# Patient Record
Sex: Male | Born: 2000 | Race: White | Hispanic: Yes | Marital: Single | Smoking: Never smoker
Health system: Southern US, Community
[De-identification: ages and names within clinical notes are randomized; demographics above are authoritative.]

## PROBLEM LIST (undated history)

## (undated) DIAGNOSIS — R6250 Unspecified lack of expected normal physiological development in childhood: Secondary | ICD-10-CM

## (undated) DIAGNOSIS — F88 Other disorders of psychological development: Secondary | ICD-10-CM

## (undated) DIAGNOSIS — F845 Asperger's syndrome: Secondary | ICD-10-CM

## (undated) DIAGNOSIS — F840 Autistic disorder: Secondary | ICD-10-CM

## (undated) DIAGNOSIS — R630 Anorexia: Secondary | ICD-10-CM

## (undated) DIAGNOSIS — F909 Attention-deficit hyperactivity disorder, unspecified type: Secondary | ICD-10-CM

## (undated) HISTORY — PX: TONSILLECTOMY AND ADENOIDECTOMY: SHX28

## (undated) HISTORY — DX: Other disorders of psychological development: F88

## (undated) HISTORY — DX: Anorexia: R63.0

## (undated) HISTORY — DX: Unspecified lack of expected normal physiological development in childhood: R62.50

## (undated) HISTORY — DX: Attention-deficit hyperactivity disorder, unspecified type: F90.9

## (undated) HISTORY — DX: Asperger's syndrome: F84.5

## (undated) HISTORY — PX: OTHER SURGICAL HISTORY: SHX169

## (undated) HISTORY — DX: Autistic disorder: F84.0

---
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
## 2019-11-07 ENCOUNTER — Ambulatory Visit: Payer: Self-pay | Attending: Internal Medicine

## 2019-11-07 DIAGNOSIS — Z23 Encounter for immunization: Secondary | ICD-10-CM

## 2019-11-07 NOTE — Progress Notes (Signed)
   Covid-19 Vaccination Clinic  Name:  Cory Neal    MRN: [id number] DOB: 08-09-2001  11/07/2019  Mr. Cory Neal was observed post Covid-19 immunization for 15 minutes without incident. He was provided with Vaccine Information Sheet and instruction to access the V-Safe system.   Cory Neal was instructed to call 911 with any severe reactions post vaccine: Marland Kitchen Difficulty breathing  . Swelling of face and throat  . A fast heartbeat  . A bad rash all over body  . Dizziness and weakness   Immunizations Administered    Name Date Dose VIS Date Route   Pfizer COVID-19 Vaccine 11/07/2019 12:58 PM 0.3 mL 08/08/2019 Intramuscular   Manufacturer: [employer], [employer]   Lot: [id number]   NDC: [address]-5

## 2019-12-01 ENCOUNTER — Ambulatory Visit: Payer: Self-pay | Attending: Internal Medicine

## 2019-12-01 DIAGNOSIS — Z23 Encounter for immunization: Secondary | ICD-10-CM

## 2019-12-01 NOTE — Progress Notes (Signed)
   Covid-19 Vaccination Clinic  Name:  Thamas Appleyard    MRN: [id number] DOB: 2000/11/20  12/01/2019  Mr. Dacanay was observed post Covid-19 immunization for 15 minutes without incident. He was provided with Vaccine Information Sheet and instruction to access the V-Safe system.   Mr. Leasure was instructed to call 911 with any severe reactions post vaccine: Marland Kitchen Difficulty breathing  . Swelling of face and throat  . A fast heartbeat  . A bad rash all over body  . Dizziness and weakness   Immunizations Administered    Name Date Dose VIS Date Route   Pfizer COVID-19 Vaccine 12/01/2019  3:08 PM 0.3 mL 08/08/2019 Intramuscular   Manufacturer: [employer], [employer]   Lot: [id number]   NDC: [address]-8

## 2020-01-07 ENCOUNTER — Ambulatory Visit: Payer: Self-pay | Attending: Internal Medicine

## 2020-01-07 DIAGNOSIS — Z20822 Contact with and (suspected) exposure to covid-19: Secondary | ICD-10-CM

## 2020-01-08 LAB — SARS-COV-2, NAA 2 DAY TAT

## 2020-01-08 LAB — NOVEL CORONAVIRUS, NAA: SARS-CoV-2, NAA: NOT DETECTED

## 2022-06-12 ENCOUNTER — Emergency Department (HOSPITAL_BASED_OUTPATIENT_CLINIC_OR_DEPARTMENT_OTHER)
Admission: EM | Admit: 2022-06-12 | Discharge: 2022-06-12 | Disposition: A | Discharge status: Home / Self Care | Attending: Emergency Medicine | Admitting: Emergency Medicine

## 2022-06-12 ENCOUNTER — Emergency Department (HOSPITAL_BASED_OUTPATIENT_CLINIC_OR_DEPARTMENT_OTHER)

## 2022-06-12 ENCOUNTER — Other Ambulatory Visit: Payer: Self-pay

## 2022-06-12 ENCOUNTER — Encounter (HOSPITAL_BASED_OUTPATIENT_CLINIC_OR_DEPARTMENT_OTHER): Payer: Self-pay | Admitting: Emergency Medicine

## 2022-06-12 DIAGNOSIS — R1031 Right lower quadrant pain: Secondary | ICD-10-CM | POA: Insufficient documentation

## 2022-06-12 LAB — URINALYSIS, ROUTINE W REFLEX MICROSCOPIC
Bilirubin Urine: NEGATIVE
Glucose, UA: NEGATIVE mg/dL
Hgb urine dipstick: NEGATIVE
Ketones, ur: NEGATIVE mg/dL
Leukocytes,Ua: NEGATIVE
Nitrite: NEGATIVE
Specific Gravity, Urine: 1.026 (ref 1.005–1.030)
pH: 6.5 (ref 5.0–8.0)

## 2022-06-12 LAB — COMPREHENSIVE METABOLIC PANEL
ALT: 26 U/L (ref 0–44)
AST: 17 U/L (ref 15–41)
Albumin: 5.1 g/dL — ABNORMAL HIGH (ref 3.5–5.0)
Alkaline Phosphatase: 94 U/L (ref 38–126)
Anion gap: 9 (ref 5–15)
BUN: 14 mg/dL (ref 6–20)
CO2: 26 mmol/L (ref 22–32)
Calcium: 9.9 mg/dL (ref 8.9–10.3)
Chloride: 103 mmol/L (ref 98–111)
Creatinine, Ser: 0.87 mg/dL (ref 0.61–1.24)
GFR, Estimated: >60 mL/min (ref >60)
Glucose, Bld: 97 mg/dL (ref 70–99)
Potassium: 4 mmol/L (ref 3.5–5.1)
Sodium: 138 mmol/L (ref 135–145)
Total Bilirubin: 0.3 mg/dL (ref 0.3–1.2)
Total Protein: 8 g/dL (ref 6.5–8.1)

## 2022-06-12 LAB — CBC
HCT: 45.8 % (ref 39.0–52.0)
Hemoglobin: 15.9 g/dL (ref 13.0–17.0)
MCH: 29 pg (ref 26.0–34.0)
MCHC: 34.7 g/dL (ref 30.0–36.0)
MCV: 83.6 fL (ref 80.0–100.0)
Platelets: 224 10*3/uL (ref 150–400)
RBC: 5.48 MIL/uL (ref 4.22–5.81)
RDW: 12.1 % (ref 11.5–15.5)
WBC: 4.8 10*3/uL (ref 4.0–10.5)
nRBC: 0 % (ref 0.0–0.2)

## 2022-06-12 LAB — LIPASE, BLOOD: Lipase: 13 U/L (ref 11–51)

## 2022-06-12 MED ORDER — IOHEXOL 300 MG/ML  SOLN
100.0000 mL | Freq: Once | INTRAMUSCULAR | Status: AC | PRN
  Administered 2022-06-12: 75 mL via INTRAVENOUS

## 2022-06-12 NOTE — ED Provider Notes (Signed)
[facility] EMERGENCY DEPT Provider Note   CSN: [id number] Arrival date & time: 06/12/22  1724     History  Chief Complaint  Patient presents with   Abdominal Pain    Cory Neal is a 21 y.o. male.  Patient is a 21 year old male past medical history of Asperger's disease presenting with his mom for complaints of abdominal pain.  She admits to right lower quadrant abdominal pain intermittently for the past 10 days.  Denies any fevers, chills, nausea, vomiting.  Last bowel movement was described as normal and yesterday.  No prior abdominal surgeries.  Sick contacts.  The history is provided by the patient. No language interpreter was used.  Abdominal Pain Associated symptoms: no chest pain, no chills, no cough, no dysuria, no fever, no hematuria, no shortness of breath, no sore throat and no vomiting        Home Medications Prior to Admission medications   Medication Sig Start Date End Date Taking? Authorizing Provider  carbamazepine (TEGRETOL) 200 MG tablet TK 1 T PO NIGHTLY FOR MOOD STABILIZATION 07/05/15   [provider]  fluvoxaMINE (LUVOX) 50 MG tablet TK 2 TS PO HS FOR OCD/ANXIETY 07/05/15   [provider]      Allergies    Patient has no known allergies.    Review of Systems   Review of Systems  Constitutional:  Negative for chills and fever.  HENT:  Negative for ear pain and sore throat.   Eyes:  Negative for pain and visual disturbance.  Respiratory:  Negative for cough and shortness of breath.   Cardiovascular:  Negative for chest pain and palpitations.  Gastrointestinal:  Positive for abdominal pain. Negative for vomiting.  Genitourinary:  Negative for dysuria and hematuria.  Musculoskeletal:  Negative for arthralgias and back pain.  Skin:  Negative for color change and rash.  Neurological:  Negative for seizures and syncope.  All other systems reviewed and are negative.   Physical Exam Updated Vital Signs BP 121/87    Pulse 84   Temp 98.2 F (36.8 C) (Oral)   Resp 16   SpO2 97%  Physical Exam Vitals and nursing note reviewed.  Constitutional:      General: He is not in acute distress.    Appearance: He is well-developed.  HENT:     Head: Normocephalic and atraumatic.  Eyes:     Conjunctiva/sclera: Conjunctivae normal.  Cardiovascular:     Rate and Rhythm: Normal rate and regular rhythm.     Heart sounds: No murmur heard. Pulmonary:     Effort: Pulmonary effort is normal. No respiratory distress.     Breath sounds: Normal breath sounds.  Abdominal:     Palpations: Abdomen is soft.     Tenderness: There is abdominal tenderness in the right lower quadrant. There is no guarding or rebound.  Musculoskeletal:        General: No swelling.     Cervical back: Neck supple.  Skin:    General: Skin is warm and dry.     Capillary Refill: Capillary refill takes less than 2 seconds.  Neurological:     Mental Status: He is alert.  Psychiatric:        Mood and Affect: Mood normal.     ED Results / Procedures / Treatments   Labs (all labs ordered are listed, but only abnormal results are displayed) Labs Reviewed  COMPREHENSIVE METABOLIC PANEL - Abnormal; Notable for the following components:      Result Value  Albumin 5.1 (*)    All other components within normal limits  URINALYSIS, ROUTINE W REFLEX MICROSCOPIC - Abnormal; Notable for the following components:   Protein, ur TRACE (*)    All other components within normal limits  LIPASE, BLOOD  CBC    EKG None  Radiology CT ABDOMEN PELVIS W CONTRAST  Result Date: 06/12/2022 CLINICAL DATA:  Right lower abdominal pain EXAM: CT ABDOMEN AND PELVIS WITH CONTRAST TECHNIQUE: Multidetector CT imaging of the abdomen and pelvis was performed using the standard protocol following bolus administration of intravenous contrast. RADIATION DOSE REDUCTION: This exam was performed according to the departmental dose-optimization program which includes  automated exposure control, adjustment of the mA and/or kV according to patient size and/or use of iterative reconstruction technique. CONTRAST:  71mL OMNIPAQUE IOHEXOL 300 MG/ML  SOLN COMPARISON:  None Available. FINDINGS: Lower chest: No acute abnormality Hepatobiliary: No focal hepatic abnormality. Gallbladder unremarkable. Pancreas: No focal abnormality or ductal dilatation. Spleen: No focal abnormality.  Normal size. Adrenals/Urinary Tract: No adrenal abnormality. No focal renal abnormality. No stones or hydronephrosis. Urinary bladder is unremarkable. Stomach/Bowel: Normal appendix. Stomach, large and small bowel grossly unremarkable. Vascular/Lymphatic: No evidence of aneurysm or adenopathy. Reproductive: No visible focal abnormality. Other: No free fluid or free air. Musculoskeletal: No acute bony abnormality. IMPRESSION: Normal appendix. No acute findings in the abdomen or pelvis. Electronically Signed   By: Rolm Baptise M.D.   On: 06/12/2022 20:26    Procedures Procedures    Medications Ordered in ED Medications  iohexol (OMNIPAQUE) 300 MG/ML solution 100 mL (75 mLs Intravenous Contrast Given 06/12/22 2011)    ED Course/ Medical Decision Making/ A&P                           Medical Decision Making Amount and/or Complexity of Data Reviewed Labs: ordered.   73:107 PM   21 year old male past medical history of Asperger's disease presenting with his mom for complaints of abdominal pain.  Patient is alert oriented x3, no acute distress, afebrile, stable vital signs.  Physical exam demonstrates soft abdomen without guarding or rigidity.  Tenderness to palpation of the right lower quadrant.  Differential diagnosis includes but is not limited to constipation, appendicitis, diverticulitis, hernia, etc.  I independently interpreted patient's labs.  Laboratory studies demonstrate no signs or symptoms of sepsis.  No leukocytosis.  Normal renal function.  Stable liver profile and lipase.  CT  abd/pelvis with iv and oral contrast demonstrates no acute process. No appendicitis. Appendix visualized.   Patient offered medication for pain and declined at this time.  Able to tolerate p.o. without difficulty.  Patient in no distress and overall condition improved here in the ED. Detailed discussions were had with the patient regarding current findings, and need for close f/u with PCP or on call doctor. The patient has been instructed to return immediately if the symptoms worsen in any way for re-evaluation. Patient verbalized understanding and is in agreement with current care plan. All questions answered prior to discharge.         Final Clinical Impression(s) / ED Diagnoses Final diagnoses:  RLQ abdominal pain    Rx / DC Orders ED Discharge Orders     None         Lianne Cure, DO 16/10/96 2133

## 2022-06-12 NOTE — ED Triage Notes (Signed)
Abdominal pain started last Wednesday. Lower abdo right side. Comes and goes

## 2022-06-12 NOTE — Discharge Instructions (Signed)
Today patient had stable blood work including a liver profile, lipase measuring the pancreatic function, and renal function.  There is no signs or symptoms of infection or sepsis.  No elevated white blood cell count indicating infection.  CT scan of the abdomen and pelvis demonstrates no acute process.  No bowel disease.  No constipation.  No appendicitis.     Please follow-up with your primary care physician in the next 3 to 5 days if right lower quadrant abdominal pain does not improve.

## 2022-06-12 NOTE — ED Provider Triage Note (Signed)
Emergency Medicine Provider Triage Evaluation Note  Cory Neal , a 21 y.o. male  was evaluated in triage.  Pt complains of right lower abdominal pain.  Pt reports pain on and off for 10 days.  Pain is worse now  Review of Systems  Positive: Right lower quadrant pain Negative: fever   Physical Exam  BP 119/84 (BP Location: Right Arm)   Pulse 73   Temp 98.2 F (36.8 C) (Oral)   Resp 16   SpO2 99%  Gen:   Awake, no distress   Resp:  Normal effort  MSK:   Moves extremities without difficulty  Other:  Tender right lower quadrant   Medical Decision Making  Medically screening exam initiated at 5:38 PM.  Appropriate orders placed.  Cory Neal was informed that the remainder of the evaluation will be completed by another provider, this initial triage assessment does not replace that evaluation, and the importance of remaining in the ED until their evaluation is complete.     Fransico Meadow, [place] 06/12/22 1912

## 2023-04-03 ENCOUNTER — Ambulatory Visit: Admitting: Nurse Practitioner

## 2023-04-03 ENCOUNTER — Encounter: Payer: Self-pay | Admitting: Nurse Practitioner

## 2023-04-03 VITALS — BP 110/76 | HR 86 | Ht 66.0 in | Wt 160.0 lb

## 2023-04-03 DIAGNOSIS — Z789 Other specified health status: Secondary | ICD-10-CM

## 2023-04-03 NOTE — Progress Notes (Signed)
[facility]  Subjective:  Patient ID: Cory Neal, male    DOB: 02/06/2001  Age: 22 y.o. MRN: [id number]  CC: Wellness Exam   HPI Cory Neal presents for wellness exam visit for insurance benefit.  Patient has a PCP: Dr. Ricci Barker at [place].  PMH significant for: ADHD and Autism.  Last labs per PCP were completed: Sept 2023  Health Maintenance:  Colonoscopy: No family hx PSA: No family hx    Smoker: never  Immunizations:  Tdap: up to date  COVID- x1 Flu: sometimes.   Lifestyle: Diet- no particular diet  Exercise- very active at work     Past Medical History:  Diagnosis Date   ADHD (attention deficit hyperactivity disorder)    Asperger's disorder    Autism disorder    Global developmental delay    Physical growth delay    Poor appetite     Past Surgical History:  Procedure Laterality Date   arm surgery     pressure equalization tubes     TONSILLECTOMY AND ADENOIDECTOMY      Outpatient Medications Prior to Visit  Medication Sig Dispense Refill   carbamazepine (TEGRETOL) 200 MG tablet TK 1 T PO NIGHTLY FOR MOOD STABILIZATION  1   fluvoxaMINE (LUVOX) 50 MG tablet TK 2 TS PO HS FOR OCD/ANXIETY  1   No facility-administered medications prior to visit.    ROS Review of Systems  Constitutional:  Negative for fatigue.  HENT:  Negative for hearing loss.   Eyes:  Negative for visual disturbance.  Cardiovascular:  Negative for chest pain and leg swelling.  Gastrointestinal:  Negative for constipation, diarrhea, nausea and vomiting.  Musculoskeletal:  Negative for arthralgias and back pain.  Neurological:  Negative for dizziness and headaches.  Psychiatric/Behavioral:  Negative for sleep disturbance. The patient is not hyperactive (stable on current treatment.).     Objective:  BP 110/76   Pulse 86   Ht 5\' 6"  (1.676 m)   Wt 160 lb (72.6 kg)   SpO2 97%   BMI 25.82 kg/m   Physical Exam Constitutional:      General: He is not in acute  distress. HENT:     Head: Normocephalic.     Right Ear: Tympanic membrane, ear canal and external ear normal.     Left Ear: Tympanic membrane, ear canal and external ear normal. There is impacted cerumen.     Nose: Nose normal.     Mouth/Throat:     Mouth: Mucous membranes are moist.     Pharynx: Oropharynx is clear.  Eyes:     Pupils: Pupils are equal, round, and reactive to light.  Cardiovascular:     Rate and Rhythm: Normal rate and regular rhythm.     Heart sounds: Normal heart sounds.  Pulmonary:     Effort: Pulmonary effort is normal.     Breath sounds: Normal breath sounds.  Abdominal:     General: Abdomen is flat. Bowel sounds are normal.     Palpations: Abdomen is soft.  Musculoskeletal:        General: Normal range of motion.     Right lower leg: No edema.     Left lower leg: No edema.  Neurological:     General: No focal deficit present.     Mental Status: He is alert and oriented to person, place, and time.  Psychiatric:        Mood and Affect: Mood normal.        Behavior: Behavior  normal.        Thought Content: Thought content normal.        Judgment: Judgment normal.      Assessment & Plan:    Eryx was seen today for wellness exam.  Diagnoses and all orders for this visit:  Participant in health and wellness plan   Adult wellness physical was conducted today. Importance of diet and exercise were discussed in detail.  We reviewed immunizations and gave recommendations regarding current immunization needed for age.  Preventative health exams are up to date.  Patient was advised yearly wellness exam  No orders of the defined types were placed in this encounter.   No orders of the defined types were placed in this encounter.   Follow-up: as needed

## 2023-12-28 DIAGNOSIS — F902 Attention-deficit hyperactivity disorder, combined type: Secondary | ICD-10-CM | POA: Diagnosis not present

## 2023-12-28 DIAGNOSIS — F422 Mixed obsessional thoughts and acts: Secondary | ICD-10-CM | POA: Diagnosis not present

## 2023-12-28 DIAGNOSIS — F848 Other pervasive developmental disorders: Secondary | ICD-10-CM | POA: Diagnosis not present

## 2024-01-15 DIAGNOSIS — F4322 Adjustment disorder with anxiety: Secondary | ICD-10-CM | POA: Diagnosis not present

## 2024-01-16 DIAGNOSIS — F909 Attention-deficit hyperactivity disorder, unspecified type: Secondary | ICD-10-CM | POA: Diagnosis not present

## 2024-01-16 DIAGNOSIS — F429 Obsessive-compulsive disorder, unspecified: Secondary | ICD-10-CM | POA: Diagnosis not present

## 2024-01-16 DIAGNOSIS — Z8249 Family history of ischemic heart disease and other diseases of the circulatory system: Secondary | ICD-10-CM | POA: Diagnosis not present

## 2024-01-16 DIAGNOSIS — Z818 Family history of other mental and behavioral disorders: Secondary | ICD-10-CM | POA: Diagnosis not present

## 2024-01-16 DIAGNOSIS — Z791 Long term (current) use of non-steroidal anti-inflammatories (NSAID): Secondary | ICD-10-CM | POA: Diagnosis not present

## 2024-02-05 DIAGNOSIS — F4322 Adjustment disorder with anxiety: Secondary | ICD-10-CM | POA: Diagnosis not present

## 2024-03-04 DIAGNOSIS — F4322 Adjustment disorder with anxiety: Secondary | ICD-10-CM | POA: Diagnosis not present

## 2024-03-14 DIAGNOSIS — F4322 Adjustment disorder with anxiety: Secondary | ICD-10-CM | POA: Diagnosis not present

## 2024-03-19 DIAGNOSIS — F422 Mixed obsessional thoughts and acts: Secondary | ICD-10-CM | POA: Diagnosis not present

## 2024-03-19 DIAGNOSIS — F848 Other pervasive developmental disorders: Secondary | ICD-10-CM | POA: Diagnosis not present

## 2024-03-19 DIAGNOSIS — F902 Attention-deficit hyperactivity disorder, combined type: Secondary | ICD-10-CM | POA: Diagnosis not present

## 2024-04-11 DIAGNOSIS — F4322 Adjustment disorder with anxiety: Secondary | ICD-10-CM | POA: Diagnosis not present

## 2024-05-13 DIAGNOSIS — F4322 Adjustment disorder with anxiety: Secondary | ICD-10-CM | POA: Diagnosis not present

## 2024-05-27 DIAGNOSIS — E559 Vitamin D deficiency, unspecified: Secondary | ICD-10-CM | POA: Diagnosis not present

## 2024-05-27 DIAGNOSIS — Z5181 Encounter for therapeutic drug level monitoring: Secondary | ICD-10-CM | POA: Diagnosis not present

## 2024-05-27 DIAGNOSIS — Z0000 Encounter for general adult medical examination without abnormal findings: Secondary | ICD-10-CM | POA: Diagnosis not present

## 2024-06-10 DIAGNOSIS — F4322 Adjustment disorder with anxiety: Secondary | ICD-10-CM | POA: Diagnosis not present

## 2024-06-11 DIAGNOSIS — F902 Attention-deficit hyperactivity disorder, combined type: Secondary | ICD-10-CM | POA: Diagnosis not present

## 2024-06-11 DIAGNOSIS — F848 Other pervasive developmental disorders: Secondary | ICD-10-CM | POA: Diagnosis not present

## 2024-06-11 DIAGNOSIS — F422 Mixed obsessional thoughts and acts: Secondary | ICD-10-CM | POA: Diagnosis not present

## 2024-06-17 DIAGNOSIS — F4322 Adjustment disorder with anxiety: Secondary | ICD-10-CM | POA: Diagnosis not present

## 2024-07-01 DIAGNOSIS — F4322 Adjustment disorder with anxiety: Secondary | ICD-10-CM | POA: Diagnosis not present

## 2024-07-09 DIAGNOSIS — F4322 Adjustment disorder with anxiety: Secondary | ICD-10-CM | POA: Diagnosis not present

## 2024-07-15 DIAGNOSIS — F4322 Adjustment disorder with anxiety: Secondary | ICD-10-CM | POA: Diagnosis not present

## 2024-07-22 DIAGNOSIS — F4322 Adjustment disorder with anxiety: Secondary | ICD-10-CM | POA: Diagnosis not present

## 2024-07-29 DIAGNOSIS — F4322 Adjustment disorder with anxiety: Secondary | ICD-10-CM | POA: Diagnosis not present

## 2024-07-30 DIAGNOSIS — F4322 Adjustment disorder with anxiety: Secondary | ICD-10-CM | POA: Diagnosis not present

## 2024-08-05 DIAGNOSIS — F4322 Adjustment disorder with anxiety: Secondary | ICD-10-CM | POA: Diagnosis not present

## 2024-08-12 DIAGNOSIS — F4322 Adjustment disorder with anxiety: Secondary | ICD-10-CM | POA: Diagnosis not present

## 2024-08-13 DIAGNOSIS — F4322 Adjustment disorder with anxiety: Secondary | ICD-10-CM | POA: Diagnosis not present
